# Patient Record
Sex: Female | Born: 1980 | Race: White | Hispanic: No | Marital: Married | Smoking: Former smoker
Health system: Southern US, Community
[De-identification: ages and names within clinical notes are randomized; demographics above are authoritative.]

## PROBLEM LIST (undated history)

## (undated) DIAGNOSIS — Z8669 Personal history of other diseases of the nervous system and sense organs: Secondary | ICD-10-CM

## (undated) HISTORY — DX: Personal history of other diseases of the nervous system and sense organs: Z86.69

---

## 2005-01-08 ENCOUNTER — Ambulatory Visit: Payer: Self-pay | Admitting: *Deleted

## 2014-07-16 ENCOUNTER — Ambulatory Visit (INDEPENDENT_AMBULATORY_CARE_PROVIDER_SITE_OTHER): Payer: BC Managed Care – PPO

## 2014-07-16 ENCOUNTER — Ambulatory Visit (INDEPENDENT_AMBULATORY_CARE_PROVIDER_SITE_OTHER): Payer: BC Managed Care – PPO | Admitting: Family Medicine

## 2014-07-16 VITALS — BP 106/68 | HR 68 | Temp 98.1°F | Resp 16 | Ht 67.0 in | Wt 136.2 lb

## 2014-07-16 DIAGNOSIS — R058 Other specified cough: Secondary | ICD-10-CM

## 2014-07-16 DIAGNOSIS — J209 Acute bronchitis, unspecified: Secondary | ICD-10-CM

## 2014-07-16 DIAGNOSIS — R05 Cough: Secondary | ICD-10-CM

## 2014-07-16 MED ORDER — BENZONATATE 200 MG PO CAPS: 200.0000 mg | ORAL_CAPSULE | Freq: Three times a day (TID) | ORAL | Status: DC | PRN

## 2014-07-16 MED ORDER — AZITHROMYCIN 250 MG PO TABS: ORAL_TABLET | ORAL | Status: DC

## 2014-07-16 NOTE — Patient Instructions (Signed)

## 2014-07-16 NOTE — Progress Notes (Signed)
° °  Subjective:    Patient ID: Shelby [name], female    DOB: 1980-10-23, 33 y.o.   MRN: [id number] This chart was scribed for Elvina SidleKurt Lauenstein, MD by Littie Deedsichard Sun, Medical Scribe. This patient was seen in Room 9 and the patient's care was started at 10:35 AM.    HPI HPI Comments: Shelby [name] is a 33 y.o. female who presents to the [facility] complaining of gradual onset, gradually worsening productive cough of dark green sputum that began about 1 month ago. Patient did not initially feel bad or have other symptoms when the cough began, but she woke up this morning feeling "swollen." The cough is noted to be worse in the morning, 1-2 hours after waking up, and she states she feels like she is "drowning." She has been able to sleep fine with the cough however. Patient denies fever and SOB. She also denies hx of asthma and smoking.  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   Review of Systems  Constitutional: Negative for fever.  Respiratory: Positive for cough and chest tightness. Negative for shortness of breath.   Psychiatric/Behavioral: Negative for sleep disturbance.       Objective:   Physical Exam CONSTITUTIONAL: Well developed/well nourished HEAD: Normocephalic/atraumatic EYES: EOM/PERRL ENMT: Mucous membranes moist NECK: supple no meningeal signs SPINE: entire spine nontender CV: S1/S2 noted, no murmurs/rubs/gallops noted LUNGS: Lungs are clear to auscultation bilaterally, no apparent distress ABDOMEN: soft, nontender, no rebound or guarding GU: no cva tenderness NEURO: Pt is awake/alert, moves all extremitiesx4 EXTREMITIES: pulses normal, full ROM SKIN: warm, color normal PSYCH: no abnormalities of mood noted  [facility] reading (PRIMARY) by  Dr. Milus GlazierLauenstein:  CXR-hyperinflation.        Assessment & Plan:   This chart was scribed in my presence and reviewed by me personally.    ICD-9-CM ICD-10-CM   1. Productive cough 786.2 R05 DG Chest 2 View    2. Acute bronchitis, unspecified organism 466.0 J20.9 benzonatate (TESSALON) 200 MG capsule     azithromycin (ZITHROMAX Z-PAK) 250 MG tablet     Signed, Elvina SidleKurt Lauenstein, MD

## 2015-12-12 ENCOUNTER — Other Ambulatory Visit: Payer: Self-pay | Admitting: Gastroenterology

## 2015-12-12 DIAGNOSIS — R079 Chest pain, unspecified: Secondary | ICD-10-CM

## 2015-12-20 ENCOUNTER — Ambulatory Visit (HOSPITAL_COMMUNITY): Payer: Self-pay

## 2015-12-20 ENCOUNTER — Other Ambulatory Visit (HOSPITAL_COMMUNITY): Payer: Self-pay

## 2016-02-06 ENCOUNTER — Other Ambulatory Visit: Payer: Self-pay | Admitting: Gastroenterology

## 2016-02-06 DIAGNOSIS — R6881 Early satiety: Secondary | ICD-10-CM

## 2016-02-06 DIAGNOSIS — R1084 Generalized abdominal pain: Secondary | ICD-10-CM

## 2016-02-10 ENCOUNTER — Ambulatory Visit
Admission: RE | Admit: 2016-02-10 | Discharge: 2016-02-10 | Disposition: A | Discharge status: Home / Self Care | Payer: BLUE CROSS/BLUE SHIELD | Source: Ambulatory Visit | Attending: Gastroenterology | Admitting: Gastroenterology

## 2016-02-10 DIAGNOSIS — R1084 Generalized abdominal pain: Secondary | ICD-10-CM

## 2016-02-10 MED ORDER — IOPAMIDOL (ISOVUE-300) INJECTION 61%
100.0000 mL | Freq: Once | INTRAVENOUS | Status: AC | PRN
  Administered 2016-02-10: 100 mL via INTRAVENOUS

## 2016-02-15 ENCOUNTER — Ambulatory Visit (HOSPITAL_COMMUNITY): Payer: BLUE CROSS/BLUE SHIELD | Attending: Gastroenterology

## 2017-02-28 IMAGING — CT CT ABD-PELV W/ CM
2 of 4 series · 10 of 36 positions shown, 17 images · IV contrast (READICAT/WATER & [ID] ISOVUE 300)
Comparison: None.

CLINICAL DATA: Mid to upper abdominal pain

EXAM:
CT ABDOMEN AND PELVIS WITH CONTRAST
TECHNIQUE: Multidetector CT imaging of the abdomen and pelvis was performed
using the standard protocol following bolus administration of
intravenous contrast.
CONTRAST:  100mL S3PGAM-UCC IOPAMIDOL (S3PGAM-UCC) INJECTION 61%

[Series 3: abd/pelvis with · axial · 0.73mm/px · z∈[-430,-86]mm · 9 of 87 slices shown, 15 images]
[im 9/87  soft-tissue]
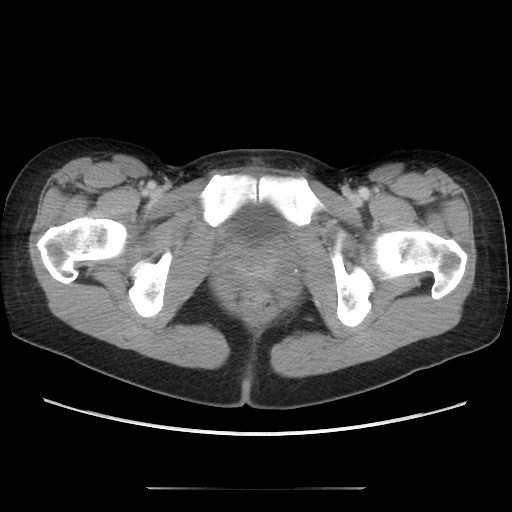
[im 9/87  bone]
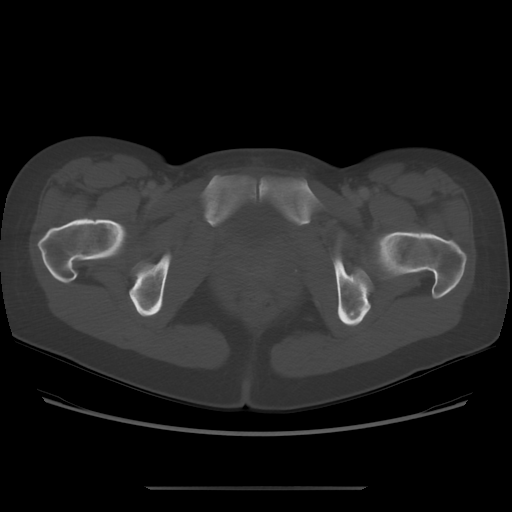
[im 18/87  soft-tissue]
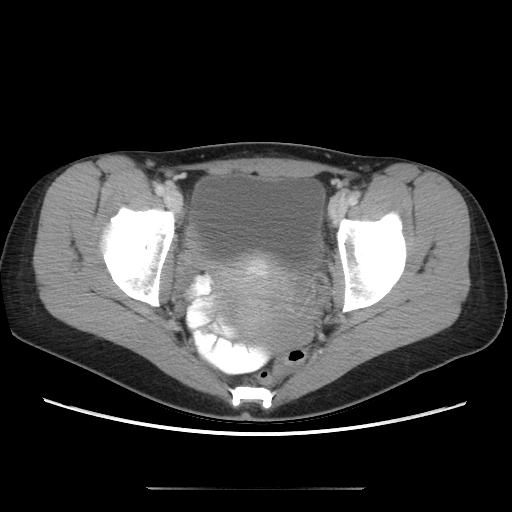
[im 26/87  soft-tissue]
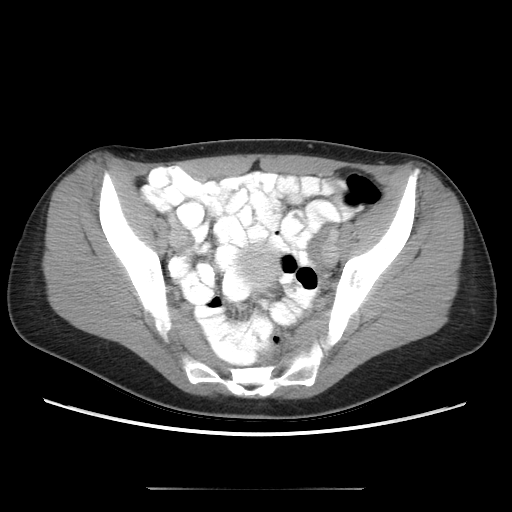
[im 35/87  soft-tissue]
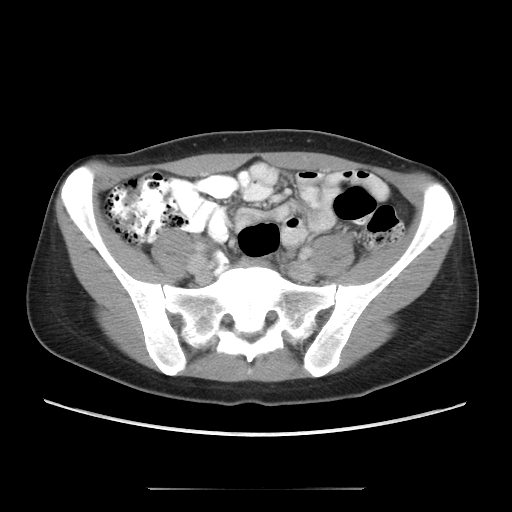
[im 44/87  soft-tissue]
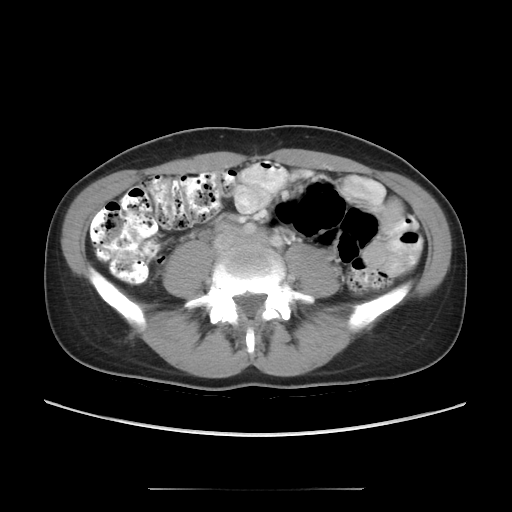
[im 52/87  soft-tissue]
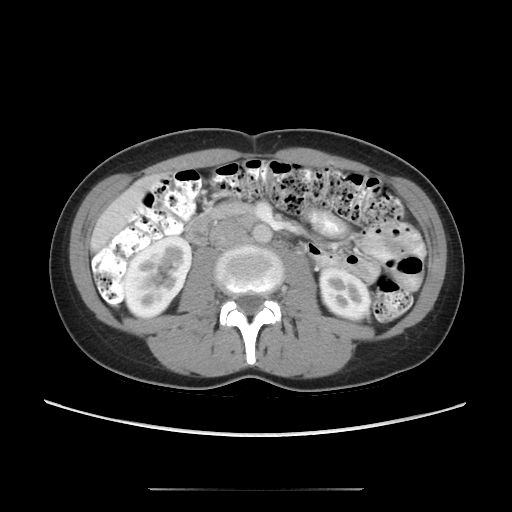
[im 52/87  lung]
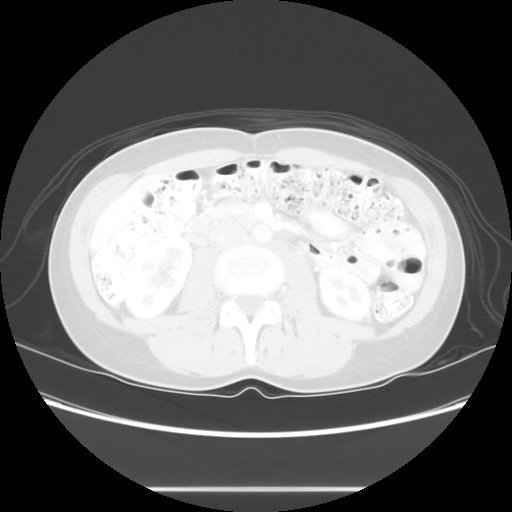
[im 61/87  soft-tissue]
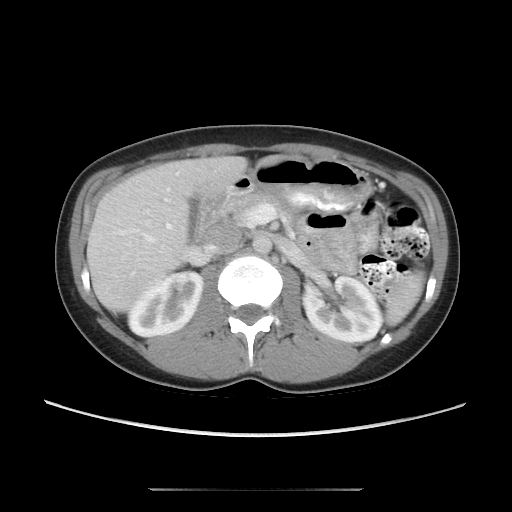
[im 61/87  lung]
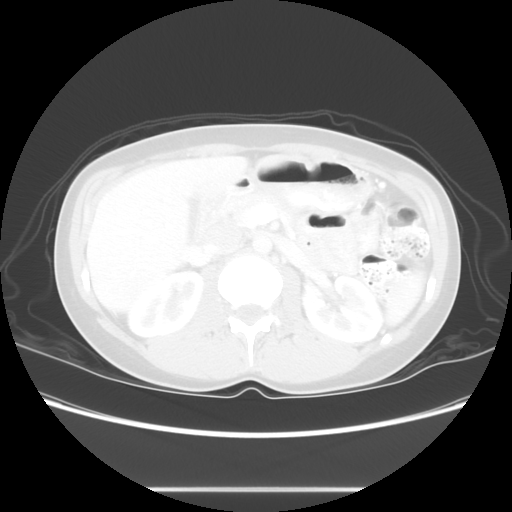
[im 69/87  soft-tissue]
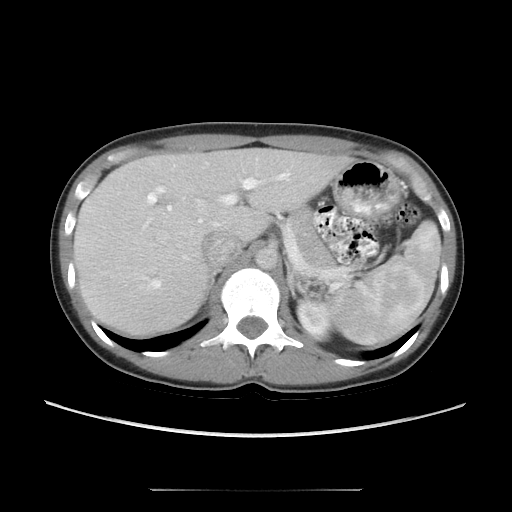
[im 69/87  lung]
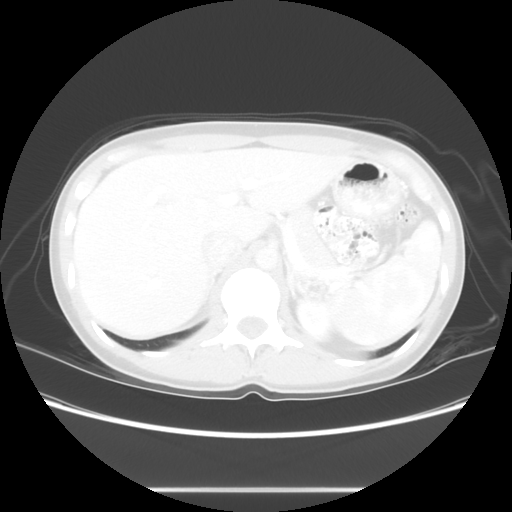
[im 78/87  soft-tissue]
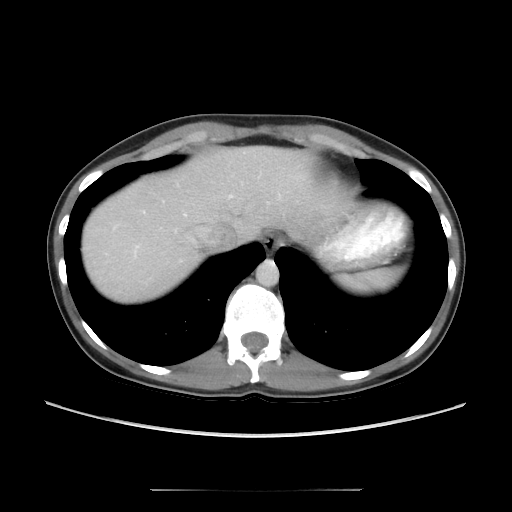
[im 78/87  lung]
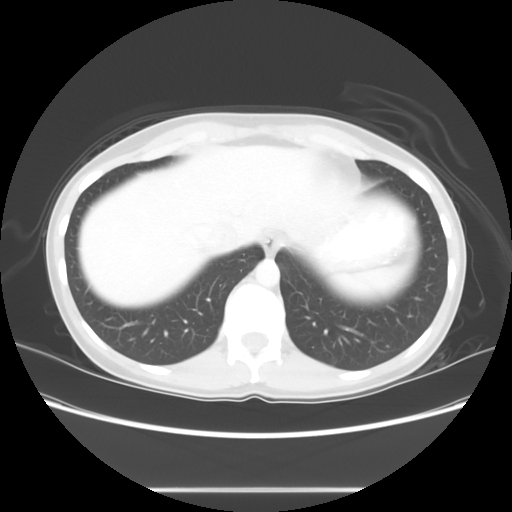
[im 78/87  bone]
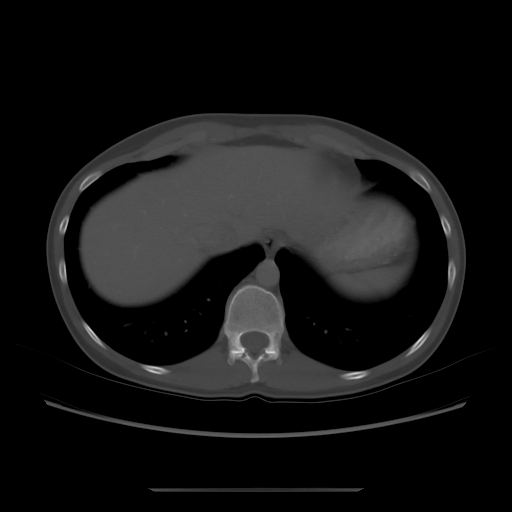

[Series 601: coronal body · coronal · 0.91mm/px · 1 of 99 slices shown, 2 images]
[im 33/99  soft-tissue]
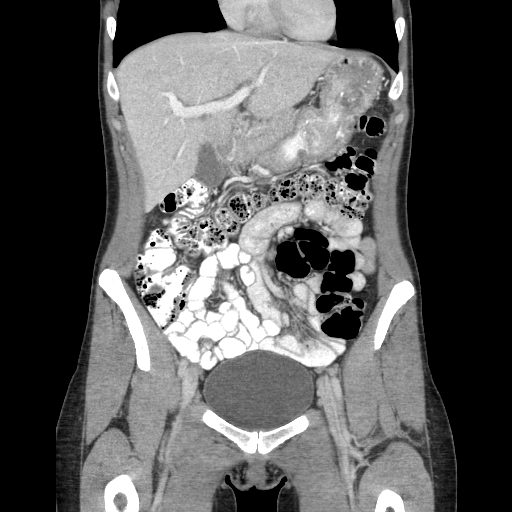
[im 33/99  bone]
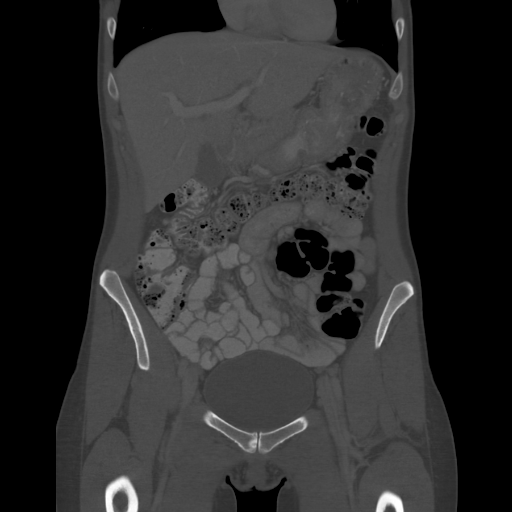

[10 of 36 positions shown; findings below may reference images not displayed]

FINDINGS: Lower chest:  Unremarkable

Hepatobiliary: Unremarkable

Pancreas: Unremarkable

Spleen: Unremarkable

Adrenals/Urinary Tract: Unremarkable

Stomach/Bowel: Unremarkable.  Appendix normal.

Vascular/Lymphatic: Unremarkable

Reproductive: Unremarkable

Other: No supplemental non-categorized findings.

Musculoskeletal: Unremarkable
IMPRESSION: 1. Normal CT appearance of the abdomen and pelvis, without a
specific cause for the patient's symptoms identified.

## 2019-03-31 HISTORY — PX: CATARACT EXTRACTION: SUR2

## 2019-08-20 ENCOUNTER — Other Ambulatory Visit: Payer: Self-pay

## 2019-08-20 ENCOUNTER — Ambulatory Visit (INDEPENDENT_AMBULATORY_CARE_PROVIDER_SITE_OTHER): Payer: 59 | Admitting: Allergy and Immunology

## 2019-08-20 ENCOUNTER — Encounter: Payer: Self-pay | Admitting: Allergy and Immunology

## 2019-08-20 VITALS — BP 122/86 | HR 65 | Temp 98.2°F | Resp 16 | Ht 68.8 in | Wt 137.0 lb

## 2019-08-20 DIAGNOSIS — K224 Dyskinesia of esophagus: Secondary | ICD-10-CM | POA: Diagnosis not present

## 2019-08-20 DIAGNOSIS — G43909 Migraine, unspecified, not intractable, without status migrainosus: Secondary | ICD-10-CM | POA: Diagnosis not present

## 2019-08-20 DIAGNOSIS — T783XXA Angioneurotic edema, initial encounter: Secondary | ICD-10-CM | POA: Diagnosis not present

## 2019-08-20 NOTE — Patient Instructions (Addendum)
  1.  Allergen avoidance measures?  2.  Remain away from all forms of caffeine including chocolate  3. Gluten free diet?  4. Blood - tryptase , C4, CBC w/diff, Celiac screen w/IgA  5. Further evaluation?  6. Obtain covid vaccine

## 2019-08-20 NOTE — Progress Notes (Signed)
[place] - [place] - [place] - [place] - [place]   Dear Dr. Elnoria Howard,  Thank you for referring Shelby [name] to the [facility] [place] on 08/20/2019.   Below is a summation of this patient's evaluation and recommendations.  Thank you for your referral. I will keep you informed about this patient's response to treatment.   If you have any questions please do not hesitate to contact me.   Sincerely,  Jessica Priest, MD Allergy / Immunology [facility]   ______________________________________________________________________    NEW PATIENT NOTE  Referring Provider: Jeani Hawking, MD Primary Provider: Gillian Scarce Date of office visit: 08/20/2019    Subjective:   Chief Complaint:  Shelby [name] (DOB: 04-Jun-1981) is a 39 y.o. female who [name] to the clinic on 08/20/2019 with a chief complaint of GI Problem .     HPI: Shelby [name] to this clinic in evaluation of possible allergic disease.  Her history dates back about 5 years at which point in time she started to develop these recurrent episodes of headache, chest fullness and pressure and tightness, coughing, a global bed feeling, and a requirement to fast for at least 8 to 12 hours.  These episodes would always present as a package.  After the first day her headache would resolve but she would still have this chest discomfort and a global bed feeling and that would take another 24 hours or so to resolve.  She never had any other associated systemic or constitutional symptoms with these episodes.  Interestingly, she sometimes gets a headache without the other components of this package.  Her headache is usually in the frontal region and is described as "constant".  It is not associated with any scotoma or dizziness but it usually remains until she can get to sleep.  She has taken ibuprofen in the past but she is  not really sure that that helps very much.  If she eats chocolate she gets an immediate headache.  If she consumes peppermint she gets an immediate headache.  She has undergone pretty extensive evaluation for this issue including evaluation with Dr. Elnoria Howard, gastroenterology, and apparently she has had a normal upper endoscopy.  As well, it sounds as though celiac disease has been ruled out.  After her very bad episode recently, which presented approximately 3 weeks ago, she had a significant alteration in what she eats.  She has eliminated all gluten, has dramatically consolidated her caffeine consumption to just 1 tea in the morning, and only eats whole foods.  Over these past 2 weeks this is the best that she has felt in a prolonged period in time.  Past Medical History:  Diagnosis Date  . History of cataract     Past Surgical History:  Procedure Laterality Date  . CATARACT EXTRACTION  03/2019    Allergies as of 08/20/2019   No Known Allergies     Medication List    VITAMIN C PO Take by mouth daily.   VITAMIN D3 PO Take by mouth daily.       Review of systems negative except as noted in HPI / PMHx or noted below:  Review of Systems  Constitutional: Negative.   HENT: Negative.   Eyes: Negative.   Respiratory: Negative.   Cardiovascular: Negative.   Gastrointestinal: Negative.   Genitourinary: Negative.   Musculoskeletal: Negative.   Skin: Negative.   Neurological: Negative.   Endo/Heme/Allergies: Negative.  Psychiatric/Behavioral: Negative.     Family History  Problem Relation Age of Onset  . Lung cancer Maternal Grandfather   . Food Allergy Father   . Asthma Father   . Other Father        Cancerous tumor on kidney  . Esophageal cancer Maternal Uncle   . Breast cancer Half-Sister     Social History   Socioeconomic History  . Marital status: Married    Spouse name: Not on file  . Number of children: Not on file  . Years of education: Not on file  .  Highest education level: Not on file  Occupational History  . Not on file  Tobacco Use  . Smoking status: Former Smoker    Types: Cigarettes  . Smokeless tobacco: Never Used  . Tobacco comment: smoked in college  Substance and Sexual Activity  . Alcohol use: Yes    Alcohol/week: 0.0 standard drinks  . Drug use: No  . Sexual activity: Not on file  Other Topics Concern  . Not on file  Social History Narrative  . Not on file   Social Determinants of Health   Financial Resource Strain:   . Difficulty of Paying Living Expenses: Not on file  Food Insecurity:   . Worried About Programme researcher, broadcasting/film/video in the Last Year: Not on file  . Ran Out of Food in the Last Year: Not on file  Transportation Needs:   . Lack of Transportation (Medical): Not on file  . Lack of Transportation (Non-Medical): Not on file  Physical Activity:   . Days of Exercise per Week: Not on file  . Minutes of Exercise per Session: Not on file  Stress:   . Feeling of Stress : Not on file  Social Connections:   . Frequency of Communication with Friends and Family: Not on file  . Frequency of Social Gatherings with Friends and Family: Not on file  . Attends Religious Services: Not on file  . Active Member of Clubs or Organizations: Not on file  . Attends Banker Meetings: Not on file  . Marital Status: Not on file  Intimate Partner Violence:   . Fear of Current or Ex-Partner: Not on file  . Emotionally Abused: Not on file  . Physically Abused: Not on file  . Sexually Abused: Not on file    Environmental and Social history  Lives in a house with a dry environment, 2 dogs located inside the household, no carpet in the bedroom, no plastic on the bed, no plastic on the pillow, and no smoking ongoing with inside the household.  She works in an office setting as a Psychologist, occupational.  Objective:   Vitals:   08/20/19 1441  BP: 122/86  Pulse: 65  Resp: 16  Temp: 98.2 F (36.8 C)  SpO2: 98%   Height: 5' 8.8"  (174.8 cm) Weight: 137 lb (62.1 kg)  Physical Exam Constitutional:      Appearance: She is not diaphoretic.  HENT:     Head: Normocephalic.     Right Ear: Tympanic membrane, ear canal and external ear normal.     Left Ear: Tympanic membrane, ear canal and external ear normal.     Nose: Nose normal. No mucosal edema or rhinorrhea.     Mouth/Throat:     Pharynx: Uvula midline. No oropharyngeal exudate.  Eyes:     Conjunctiva/sclera: Conjunctivae normal.  Neck:     Thyroid: No thyromegaly.     Trachea: Trachea normal. No  tracheal tenderness or tracheal deviation.  Cardiovascular:     Rate and Rhythm: Normal rate and regular rhythm.     Heart sounds: Normal heart sounds, S1 normal and S2 normal. No murmur.  Pulmonary:     Effort: No respiratory distress.     Breath sounds: Normal breath sounds. No stridor. No wheezing or rales.  Lymphadenopathy:     Head:     Right side of head: No tonsillar adenopathy.     Left side of head: No tonsillar adenopathy.     Cervical: No cervical adenopathy.  Skin:    Findings: No erythema or rash.     Nails: There is no clubbing.  Neurological:     Mental Status: She is alert.     Diagnostics: Allergy skin tests were performed.  She did not demonstrate any hypersensitivity against a screening panel of foods.  Assessment and Plan:    1. Migraine syndrome   2. Angioedema, initial encounter   3. Esophageal spasm     1.  Allergen avoidance measures?  2.  Remain away from all forms of caffeine including chocolate  3. Gluten free diet?  4. Blood - tryptase , C4, CBC w/diff, Celiac screen w/IgA  5. Further evaluation?  6. Obtain covid vaccine  I think the most likely explanation for Shelby [name]'s package of headache, GI symptoms, mid airway respiratory symptoms, is migraine with visceral involvement.  We will make that assumption and keep her away from all caffeine.  If she does well after a period in time I encouraged her to reintroduce  gluten to her diet to see if she truly has a hypersensitivity directed against this food product.  We will obtain blood test to rule out other etiologic factors that can be responsible for her sign symptom complex.  She will keep in contact with me noting her response to this approach and further evaluation and treatment will be based upon this response.  Jiles Prows, MD Allergy / Immunology [facility] [place]

## 2019-08-22 LAB — CBC WITH DIFFERENTIAL
Basophils Absolute: 0 10*3/uL (ref 0.0–0.2)
Basos: 1 %
EOS (ABSOLUTE): 0.1 10*3/uL (ref 0.0–0.4)
Eos: 1 %
Hematocrit: 44.3 % (ref 34.0–46.6)
Hemoglobin: 14.6 g/dL (ref 11.1–15.9)
Immature Grans (Abs): 0 10*3/uL (ref 0.0–0.1)
Immature Granulocytes: 0 %
Lymphocytes Absolute: 2 10*3/uL (ref 0.7–3.1)
Lymphs: 33 %
MCH: 30.7 pg (ref 26.6–33.0)
MCHC: 33 g/dL (ref 31.5–35.7)
MCV: 93 fL (ref 79–97)
Monocytes Absolute: 0.5 10*3/uL (ref 0.1–0.9)
Monocytes: 8 %
Neutrophils Absolute: 3.6 10*3/uL (ref 1.4–7.0)
Neutrophils: 57 %
RBC: 4.76 x10E6/uL (ref 3.77–5.28)
RDW: 11.8 % (ref 11.7–15.4)
WBC: 6.2 10*3/uL (ref 3.4–10.8)

## 2019-08-22 LAB — CELIAC PANEL 10
Antigliadin Abs, IgA: 5 units (ref 0–19)
Endomysial IgA: NEGATIVE
Gliadin IgG: 7 units (ref 0–19)
IgA/Immunoglobulin A, Serum: 257 mg/dL (ref 87–352)
Tissue Transglut Ab: 6 U/mL — ABNORMAL HIGH (ref 0–5)
Transglutaminase IgA: <2 U/mL (ref 0–3)

## 2019-08-22 LAB — TRYPTASE: Tryptase: 4.2 ug/L (ref 2.2–13.2)

## 2019-08-22 LAB — C4 COMPLEMENT: Complement C4, Serum: 22 mg/dL (ref 12–38)

## 2019-08-24 ENCOUNTER — Encounter: Payer: Self-pay | Admitting: Allergy and Immunology

## 2019-09-08 ENCOUNTER — Encounter: Payer: Self-pay | Admitting: *Deleted

## 2019-09-15 ENCOUNTER — Ambulatory Visit: Payer: Self-pay | Admitting: Allergy and Immunology

## 2021-06-19 ENCOUNTER — Emergency Department: Admit: 2021-06-19 | Discharge status: Absent without leave | Payer: Self-pay

## 2021-06-19 ENCOUNTER — Emergency Department (INDEPENDENT_AMBULATORY_CARE_PROVIDER_SITE_OTHER): Payer: 59

## 2021-06-19 ENCOUNTER — Emergency Department (INDEPENDENT_AMBULATORY_CARE_PROVIDER_SITE_OTHER)
Admission: EM | Admit: 2021-06-19 | Discharge: 2021-06-19 | Disposition: A | Discharge status: Home / Self Care | Payer: 59 | Source: Home / Self Care | Attending: Family Medicine | Admitting: Family Medicine

## 2021-06-19 DIAGNOSIS — R0602 Shortness of breath: Secondary | ICD-10-CM

## 2021-06-19 DIAGNOSIS — R0789 Other chest pain: Secondary | ICD-10-CM

## 2021-06-19 DIAGNOSIS — U071 COVID-19: Secondary | ICD-10-CM

## 2021-06-19 NOTE — Discharge Instructions (Signed)
The chest x-ray is normal No evidence of COVID-pneumonia Your symptoms are consistent with a COVID respiratory infection Continue with rest, fluids, and over-the-counter cough and cold medicine May use the albuterol as needed Call your doctor if not improving by Friday

## 2021-06-19 NOTE — ED Triage Notes (Signed)
Pt presents with worsening chest tightness, SOB, and cough since being diagnosed with Covid Friday. Pt states she has been using an albuterol inhaler approx. Q4h. Pt taking amoxicillin for prior resp. Infection.

## 2021-06-20 NOTE — ED Provider Notes (Addendum)
[facility]    CSN: [id number] Arrival date & time: 06/19/21  1549      History   Chief Complaint Chief Complaint  Patient presents with   Cough    COVID pos 11/18   Shortness of Breath   Nasal Congestion    HPI Shelby [name] is a 40 y.o. female.   HPI Patient was diagnosed with COVID 2 or 3 days ago.  She is here because she continues to have shortness of breath and cough.  She states that she has taken amoxicillin, steroids, and has an albuterol inhaler.  In spite of that she wonders about the shortness of breath.  She is never had COVID before.  She is COVID vaccinated.  No dizziness.  No chest pain  Past Medical History:  Diagnosis Date   History of cataract     There are no problems to display for this patient.   Past Surgical History:  Procedure Laterality Date   CATARACT EXTRACTION  03/2019    OB History   No obstetric history on file.      Home Medications    Prior to Admission medications   Medication Sig Start Date End Date Taking? Authorizing Provider  ALBUTEROL IN Inhale into the lungs.   Yes [provider]  amoxicillin (AMOXIL) 875 MG tablet Take 875 mg by mouth 2 (two) times daily.   Yes [provider]  Ascorbic Acid (VITAMIN C PO) Take by mouth daily.    [provider]  Cholecalciferol (VITAMIN D3 PO) Take by mouth daily.    [provider]    Family History Family History  Problem Relation Age of Onset   Lung cancer Maternal Grandfather    Food Allergy Father    Asthma Father    Other Father        Cancerous tumor on kidney   Esophageal cancer Maternal Uncle    Breast cancer Half-Sister     Social History Social History   Tobacco Use   Smoking status: Former    Types: Cigarettes   Smokeless tobacco: Never   Tobacco comments:    smoked in college  Substance Use Topics   Alcohol use: Yes    Alcohol/week: 0.0 standard drinks   Drug use: No     Allergies   Patient  has no known allergies.   Review of Systems Review of Systems See HPI  Physical Exam Triage Vital Signs ED Triage Vitals  Enc Vitals Group     BP 06/19/21 1706 (!) 142/86     Pulse Rate 06/19/21 1706 79     Resp 06/19/21 1706 16     Temp 06/19/21 1706 98 F (36.7 C)     Temp Source 06/19/21 1706 Oral     SpO2 06/19/21 1706 100 %     Weight --      Height --      Head Circumference --      Peak Flow --      Pain Score 06/19/21 1656 2     Pain Loc --      Pain Edu? --      Excl. in GC? --    No data found.  Updated Vital Signs BP (!) 142/86 (BP Location: Left Arm)   Pulse 79   Temp 98 F (36.7 C) (Oral)   Resp 16   SpO2 100%       Physical Exam Constitutional:      General: She is not in  acute distress.    Appearance: She is well-developed. She is ill-appearing.  HENT:     Head: Normocephalic and atraumatic.  Eyes:     Conjunctiva/sclera: Conjunctivae normal.     Pupils: Pupils are equal, round, and reactive to light.  Cardiovascular:     Rate and Rhythm: Normal rate and regular rhythm.     Heart sounds: Normal heart sounds.  Pulmonary:     Effort: Pulmonary effort is normal. No respiratory distress.     Breath sounds: Normal breath sounds. No decreased breath sounds, wheezing, rhonchi or rales.  Chest:     Chest wall: No tenderness.  Abdominal:     General: There is no distension.     Palpations: Abdomen is soft.  Musculoskeletal:        General: Normal range of motion.     Cervical back: Normal range of motion.  Skin:    General: Skin is warm and dry.  Neurological:     General: No focal deficit present.     Mental Status: She is alert.     UC Treatments / Results  Labs (all labs ordered are listed, but only abnormal results are displayed) Labs Reviewed - No data to display  EKG   Radiology DG Chest 2 View  Result Date: 06/19/2021 CLINICAL DATA:  Chest tightness EXAM: CHEST - 2 VIEW COMPARISON:  07/16/2014 FINDINGS: The heart size and  mediastinal contours are within normal limits. Both lungs are clear. The visualized skeletal structures are unremarkable. IMPRESSION: No active cardiopulmonary disease. Electronically Signed   By: Jasmine Pang M.D.   On: 06/19/2021 18:14    Procedures Procedures (including critical care time)  Medications Ordered in UC Medications - No data to display  Initial Impression / Assessment and Plan / UC Course  I have reviewed the triage vital signs and the nursing notes.  Pertinent labs & imaging results that were available during my care of the patient were reviewed by me and considered in my medical decision making (see chart for details).     I explained to the patient that this is typical for COVID.  Shortness of breath symptoms that outweighs physical exam findings and x-rays not unusual.  Home care is recommended.  Go to ER if worse.  Call primary care if fails to improve. Final Clinical Impressions(s) / UC Diagnoses   Final diagnoses:  COVID-19  Shortness of breath     Discharge Instructions      The chest x-ray is normal No evidence of COVID-pneumonia Your symptoms are consistent with a COVID respiratory infection Continue with rest, fluids, and over-the-counter cough and cold medicine May use the albuterol as needed Call your doctor if not improving by Friday    ED Prescriptions   None    PDMP not reviewed this encounter.   Eustace Moore, MD 06/20/21 1501    Eustace Moore, MD 06/20/21 1501

## 2021-06-25 ENCOUNTER — Other Ambulatory Visit: Payer: Self-pay

## 2021-06-25 ENCOUNTER — Emergency Department (INDEPENDENT_AMBULATORY_CARE_PROVIDER_SITE_OTHER)
Admission: EM | Admit: 2021-06-25 | Discharge: 2021-06-25 | Disposition: A | Discharge status: Home / Self Care | Payer: 59 | Source: Home / Self Care | Attending: Family Medicine | Admitting: Family Medicine

## 2021-06-25 DIAGNOSIS — R0981 Nasal congestion: Secondary | ICD-10-CM | POA: Diagnosis not present

## 2021-06-25 DIAGNOSIS — U071 COVID-19: Secondary | ICD-10-CM

## 2021-06-25 NOTE — Discharge Instructions (Signed)
Continue a low activity level, rest, drink lots of fluids I do not feel you are contagious for COVID 19 Run a humidifier if you have one Start Flonase.  2 sprays a day for the first 3 days then 1 a day until your symptoms have improved See your doctor if you fail to improve through next week

## 2021-06-25 NOTE — ED Provider Notes (Signed)
[facility]    CSN: [id number] Arrival date & time: 06/25/21  1308      History   Chief Complaint Chief Complaint  Patient presents with   Cough   Facial Pain    HPI Shelby [name] is a 40 y.o. female.   HPI  Patient went to her physician and was diagnosed with an upper respiratory infection in the beginning of October.  She was given Augmentin, prednisone, and cough medication.  As she was finishing up these medications she was exposed to COVID and became sick again.  Her COVID test was positive.  She finished her COVID medication and thought she was better.  Yesterday was feeling fairly well.  Today woke up with nasal congestion chest congestion and a pressure in her chest.  She is most unhappy that she is having a relapse of her symptoms.  She wants to know when she can go back to work.  She wants to know when she can be around her children.  She wants to know if she needs to go back on antibiotics or medication.  She took a COVID test 2 days ago and it was still positive.  Past Medical History:  Diagnosis Date   History of cataract     There are no problems to display for this patient.   Past Surgical History:  Procedure Laterality Date   CATARACT EXTRACTION  03/2019    OB History   No obstetric history on file.      Home Medications    Prior to Admission medications   Medication Sig Start Date End Date Taking? Authorizing Provider  ALBUTEROL IN Inhale into the lungs.    [provider]  Ascorbic Acid (VITAMIN C PO) Take by mouth daily.    [provider]  Cholecalciferol (VITAMIN D3 PO) Take by mouth daily.    [provider]    Family History Family History  Problem Relation Age of Onset   Lung cancer Maternal Grandfather    Food Allergy Father    Asthma Father    Other Father        Cancerous tumor on kidney   Esophageal cancer Maternal Uncle    Breast cancer Half-Sister     Social History Social  History   Tobacco Use   Smoking status: Former    Types: Cigarettes   Smokeless tobacco: Never   Tobacco comments:    smoked in college  Substance Use Topics   Alcohol use: Yes    Alcohol/week: 0.0 standard drinks   Drug use: No     Allergies   Patient has no known allergies.   Review of Systems Review of Systems See HPI  Physical Exam Triage Vital Signs ED Triage Vitals  Enc Vitals Group     BP 06/25/21 1355 (!) 142/88     Pulse Rate 06/25/21 1355 88     Resp 06/25/21 1355 16     Temp 06/25/21 1355 98.4 F (36.9 C)     Temp Source 06/25/21 1355 Oral     SpO2 06/25/21 1355 100 %     Weight --      Height --      Head Circumference --      Peak Flow --      Pain Score 06/25/21 1356 0     Pain Loc --      Pain Edu? --      Excl. in GC? --    No data found.  Updated Vital Signs BP (!) 142/88 (BP Location: Left Arm)   Pulse 88   Temp 98.4 F (36.9 C) (Oral)   Resp 16   LMP 06/24/2021 Comment: Pt states her menstrual l cycle just started yesterday.  SpO2 100%      Physical Exam Constitutional:      General: She is not in acute distress.    Appearance: She is well-developed. She is ill-appearing.  HENT:     Head: Normocephalic and atraumatic.     Right Ear: Tympanic membrane and ear canal normal.     Left Ear: Tympanic membrane and ear canal normal.     Nose: Rhinorrhea present.     Mouth/Throat:     Mouth: Mucous membranes are moist.     Pharynx: No posterior oropharyngeal erythema.  Eyes:     Conjunctiva/sclera: Conjunctivae normal.     Pupils: Pupils are equal, round, and reactive to light.  Cardiovascular:     Rate and Rhythm: Normal rate and regular rhythm.     Heart sounds: Normal heart sounds.  Pulmonary:     Effort: Pulmonary effort is normal. No respiratory distress.     Breath sounds: Normal breath sounds.  Abdominal:     General: There is no distension.     Palpations: Abdomen is soft.  Musculoskeletal:        General: Normal range  of motion.     Cervical back: Normal range of motion.  Lymphadenopathy:     Cervical: Cervical adenopathy present.  Skin:    General: Skin is warm and dry.  Neurological:     Mental Status: She is alert.     UC Treatments / Results  Labs (all labs ordered are listed, but only abnormal results are displayed) Labs Reviewed - No data to display  EKG   Radiology No results found.  Procedures Procedures (including critical care time)  Medications Ordered in UC Medications - No data to display  Initial Impression / Assessment and Plan / UC Course  I have reviewed the triage vital signs and the nursing notes.  Pertinent labs & imaging results that were available during my care of the patient were reviewed by me and considered in my medical decision making (see chart for details).     We discussed that this not unusual for some people to have symptoms related to COVID that can last a couple weeks or longer.  Symptomatic care    Final Clinical Impressions(s) / UC Diagnoses   Final diagnoses:  COVID-19  Nasal congestion     Discharge Instructions      Continue a low activity level, rest, drink lots of fluids I do not feel you are contagious for COVID 19 Run a humidifier if you have one Start Flonase.  2 sprays a day for the first 3 days then 1 a day until your symptoms have improved See your doctor if you fail to improve through next week   ED Prescriptions   None    PDMP not reviewed this encounter.   Eustace Moore, MD 06/28/21 [phone]

## 2021-06-25 NOTE — ED Triage Notes (Signed)
Pt present facial pressure with chest congestion and head. Symptom started a week ago. Pt state that 11 days ago she was positive for covid. Pt states that her symptoms for covid has gotten better just the congestion and chest tightness feels worst.

## 2022-07-08 IMAGING — DX DG CHEST 2V
2 series · 2 of 2 positions shown · non-contrast
Comparison: 07/16/2014

CLINICAL DATA: Chest tightness

EXAM:
CHEST - 2 VIEW

[chest pa]
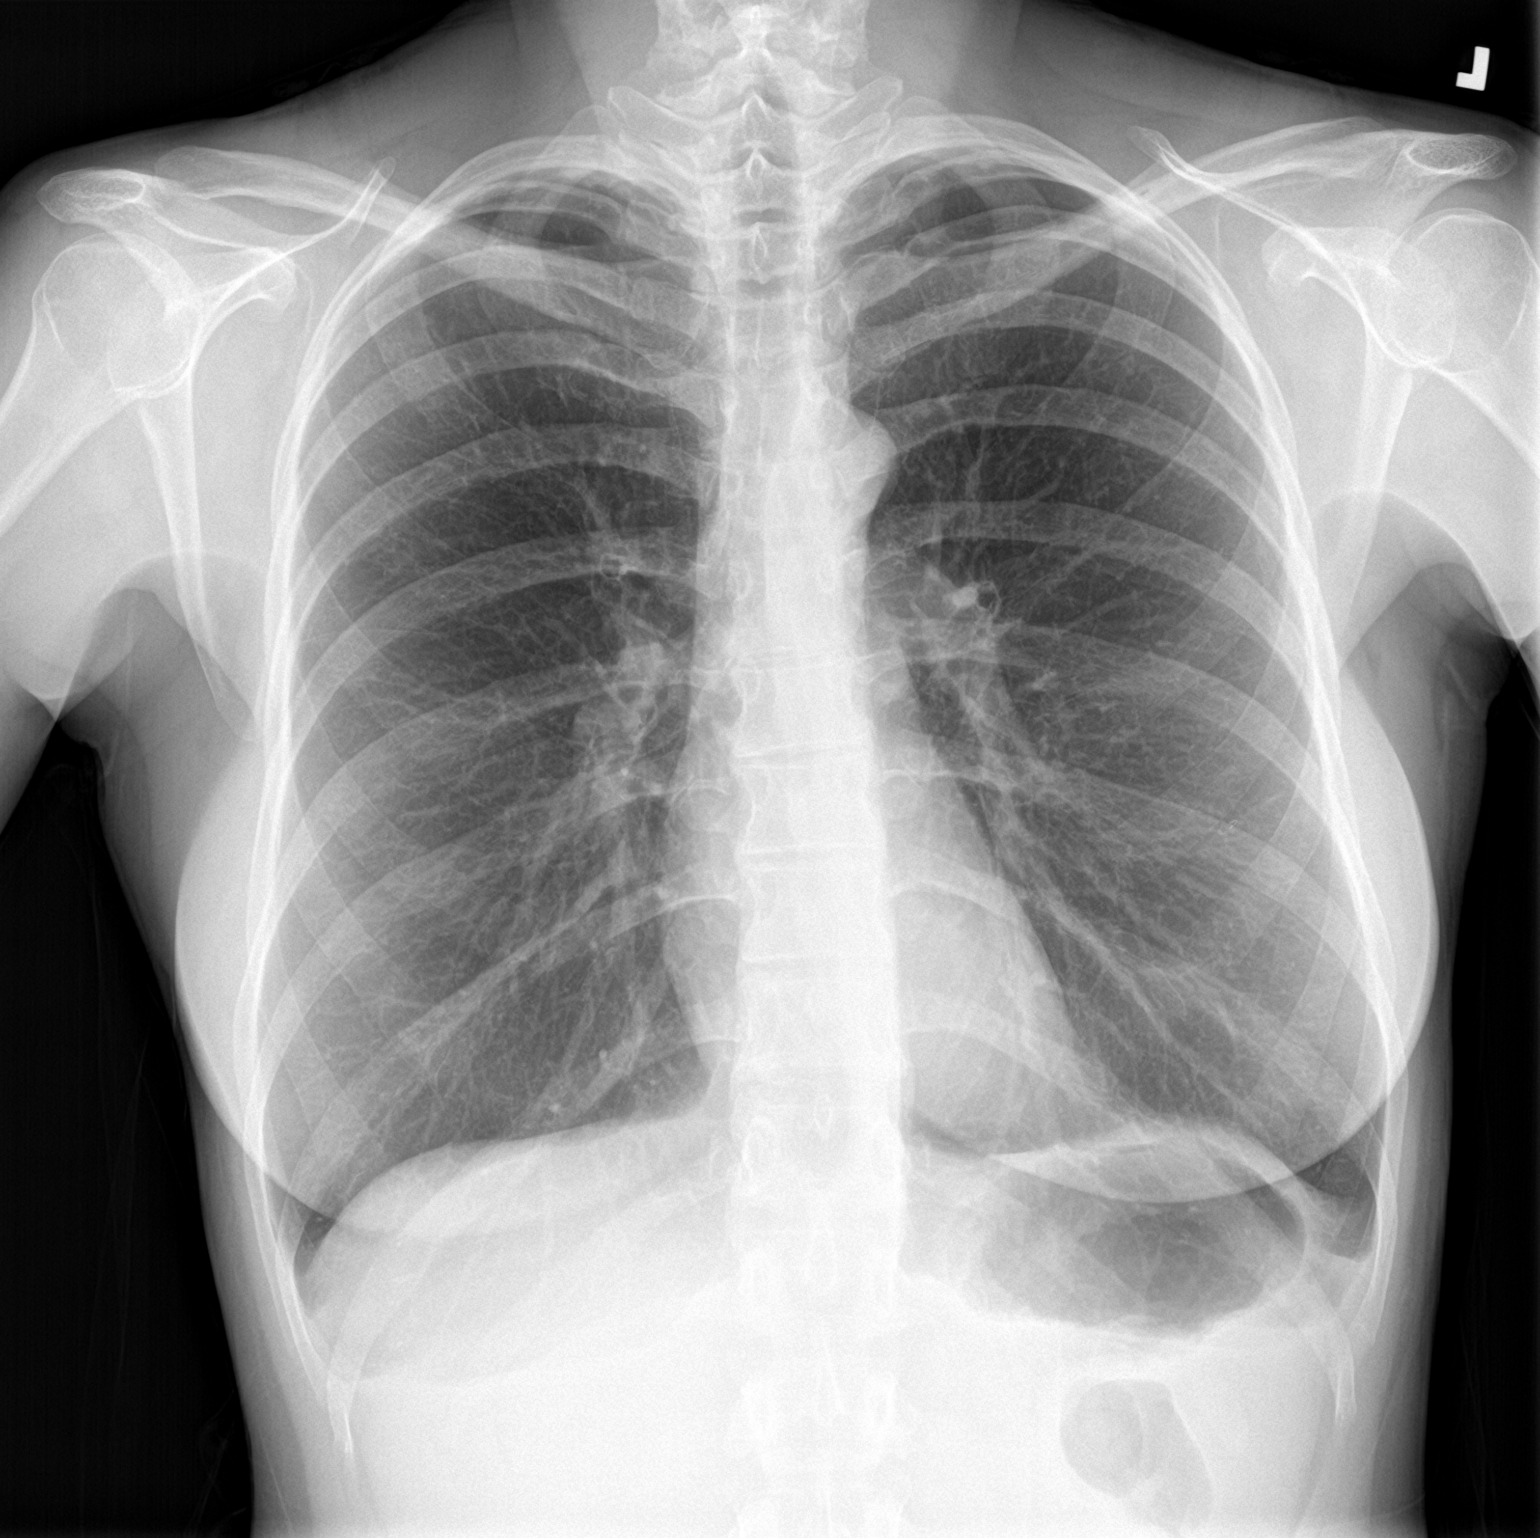

[chest lat]
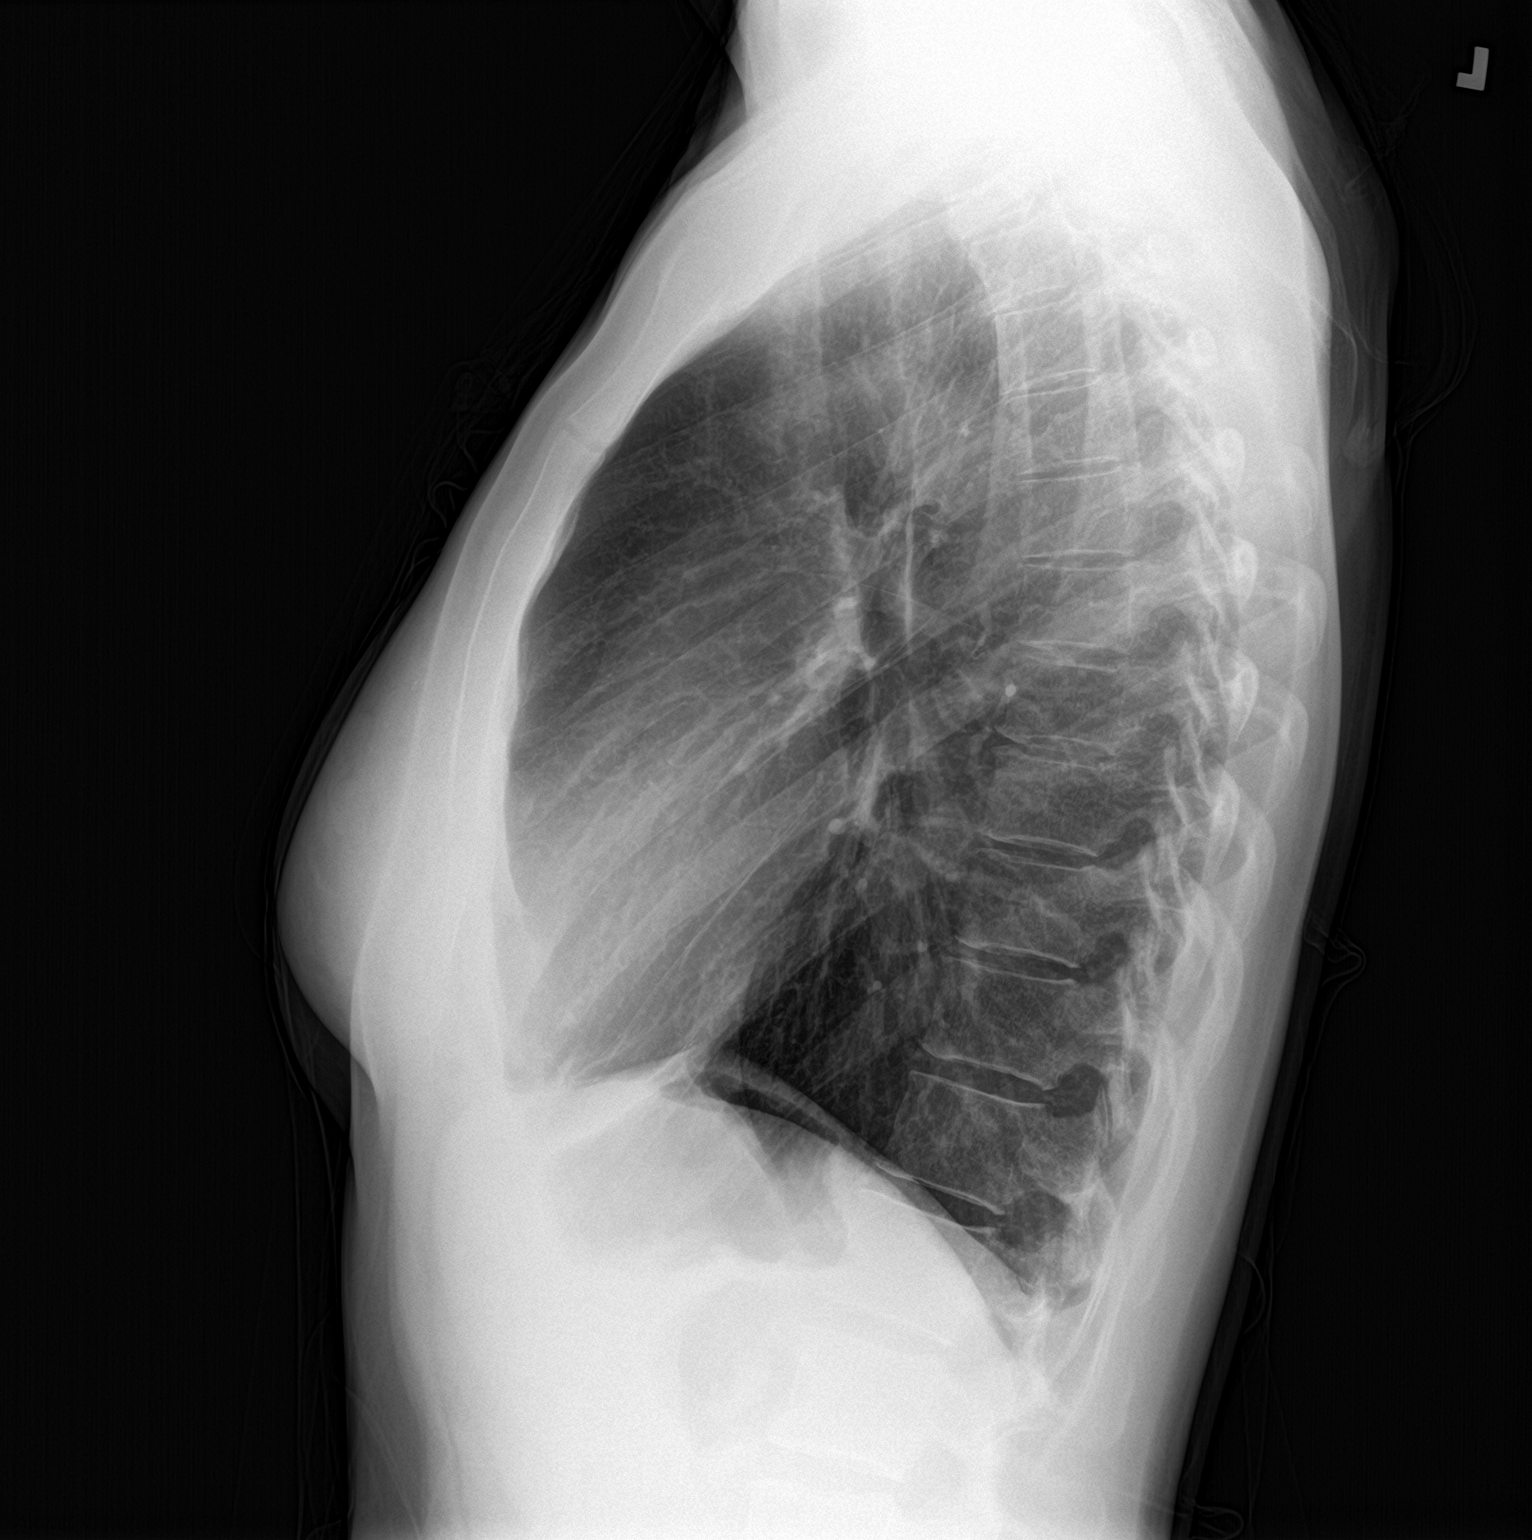

[2 of 2 positions shown; findings below may reference images not displayed]

FINDINGS: The heart size and mediastinal contours are within normal limits.
Both lungs are clear. The visualized skeletal structures are
unremarkable.
IMPRESSION: No active cardiopulmonary disease.

## 2022-07-29 ENCOUNTER — Ambulatory Visit
Admission: RE | Admit: 2022-07-29 | Discharge: 2022-07-29 | Disposition: A | Discharge status: Home / Self Care | Payer: 59 | Source: Ambulatory Visit

## 2022-07-29 ENCOUNTER — Other Ambulatory Visit: Payer: Self-pay

## 2022-07-29 VITALS — BP 116/75 | HR 57 | Temp 98.0°F | Resp 18 | Ht 70.0 in | Wt 140.0 lb

## 2022-07-29 DIAGNOSIS — J209 Acute bronchitis, unspecified: Secondary | ICD-10-CM

## 2022-07-29 MED ORDER — BENZONATATE 100 MG PO CAPS: 100.0000 mg | ORAL_CAPSULE | Freq: Three times a day (TID) | ORAL | 0 refills | Status: AC

## 2022-07-29 MED ORDER — PREDNISONE 20 MG PO TABS: 40.0000 mg | ORAL_TABLET | Freq: Every day | ORAL | 0 refills | Status: AC

## 2022-07-29 NOTE — Discharge Instructions (Signed)
Start prednisone 40 mg for 5 days.  Do not take NSAIDs with this medication including aspirin, ibuprofen/Advil, naproxen/Aleve.  You can use Tylenol/acetaminophen, Mucinex, Flonase for symptom relief.  Make sure you are resting and drinking plenty of fluid.  Use Tessalon for cough up to 3 times a day.  If your symptoms are not improving by next week please return for reevaluation.  If anything worsens and you have high fever, chest pain, shortness of breath, nausea/vomiting interfering with oral intake, weakness you need to be seen immediately.

## 2022-07-29 NOTE — ED Triage Notes (Signed)
Pt presents to Urgent Care with c/o cough x 10 days. Negative home COVID test. Reports having issues w/ her voice.

## 2022-07-29 NOTE — ED Provider Notes (Signed)
[facility]    CSN: [id number] Arrival date & time: 07/29/22  1157      History   Chief Complaint Chief Complaint  Patient presents with   Cough    HPI Shelby [name] is a 41 y.o. female.   Patient presents today with a 10-day history of URI symptoms including cough and congestion.  Reports fatigue and sore throat.  Overall she has had improvement in symptoms but they have not resolved prompting evaluation.  She denies any fever, chest pain, shortness of breath.  She has tried over-the-counter medications including cough medicine without improvement of symptoms.  She has also tried a humidifier, nasal rinses, tea, pushing fluids without relief.  Denies any history of allergies, asthma, COPD.  She did take an at-home COVID test when symptoms began that was negative.  She denies history of diabetes.  Denies any recent antibiotics or steroids.    Past Medical History:  Diagnosis Date   History of cataract     There are no problems to display for this patient.   Past Surgical History:  Procedure Laterality Date   CATARACT EXTRACTION  03/2019    OB History   No obstetric history on file.      Home Medications    Prior to Admission medications   Medication Sig Start Date End Date Taking? Authorizing Provider  benzonatate (TESSALON) 100 MG capsule Take 1 capsule (100 mg total) by mouth every 8 (eight) hours. 07/29/22  Yes Dionna Wiedemann K, PA-C  guaiFENesin (MUCINEX) 600 MG 12 hr tablet Take 600 mg by mouth 2 (two) times daily.   Yes [provider]  predniSONE (DELTASONE) 20 MG tablet Take 2 tablets (40 mg total) by mouth daily for 5 days. 07/29/22 08/03/22 Yes Airelle Everding K, PA-C  ALBUTEROL IN Inhale into the lungs.    [provider]  Ascorbic Acid (VITAMIN C PO) Take by mouth daily.    [provider]  Cholecalciferol (VITAMIN D3 PO) Take by mouth daily.    [provider]    Family History Family History  Problem  Relation Age of Onset   Healthy Mother    Cancer Father    Food Allergy Father    Asthma Father    Other Father        Cancerous tumor on kidney   Lung cancer Maternal Grandfather    Esophageal cancer Maternal Uncle    Breast cancer Half-Sister     Social History Social History   Tobacco Use   Smoking status: Former    Types: Cigarettes   Smokeless tobacco: Never   Tobacco comments:    smoked in college  Vaping Use   Vaping Use: Never used  Substance Use Topics   Alcohol use: Not Currently    Comment: occasionally   Drug use: No     Allergies   Patient has no known allergies.   Review of Systems Review of Systems  Constitutional:  Positive for activity change and fatigue. Negative for appetite change and fever.  HENT:  Positive for congestion. Negative for sinus pressure, sneezing and sore throat.   Respiratory:  Positive for cough. Negative for shortness of breath.   Cardiovascular:  Negative for chest pain.  Gastrointestinal:  Negative for abdominal pain, diarrhea, nausea and vomiting.  Neurological:  Negative for dizziness, light-headedness and headaches.     Physical Exam Triage Vital Signs ED Triage Vitals  Enc Vitals Group     BP 07/29/22 1210 116/75  Pulse Rate 07/29/22 1210 (!) 57     Resp 07/29/22 1210 18     Temp 07/29/22 1210 98 F (36.7 C)     Temp Source 07/29/22 1210 Oral     SpO2 07/29/22 1210 100 %     Weight 07/29/22 1206 140 lb (63.5 kg)     Height 07/29/22 1206 5\' 10"  (1.778 m)     Head Circumference --      Peak Flow --      Pain Score 07/29/22 1206 0     Pain Loc --      Pain Edu? --      Excl. in GC? --    No data found.  Updated Vital Signs BP 116/75   Pulse (!) 57   Temp 98 F (36.7 C) (Oral)   Resp 18   Ht 5\' 10"  (1.778 m)   Wt 140 lb (63.5 kg)   LMP 07/25/2022   SpO2 100%   BMI 20.09 kg/m   Visual Acuity Right Eye Distance:   Left Eye Distance:   Bilateral Distance:    Right Eye Near:   Left Eye Near:     Bilateral Near:     Physical Exam Vitals reviewed.  Constitutional:      General: She is awake. She is not in acute distress.    Appearance: Normal appearance. She is well-developed. She is not ill-appearing.     Comments: Very pleasant female appears stated age in no acute distress sitting comfortably in exam room  HENT:     Head: Normocephalic and atraumatic.     Right Ear: Tympanic membrane, ear canal and external ear normal. Tympanic membrane is not erythematous or bulging.     Left Ear: Tympanic membrane, ear canal and external ear normal. Tympanic membrane is not erythematous or bulging.     Nose:     Right Sinus: No maxillary sinus tenderness or frontal sinus tenderness.     Left Sinus: No maxillary sinus tenderness or frontal sinus tenderness.     Mouth/Throat:     Pharynx: Uvula midline. No oropharyngeal exudate or posterior oropharyngeal erythema.  Cardiovascular:     Rate and Rhythm: Normal rate and regular rhythm.     Heart sounds: Normal heart sounds, S1 normal and S2 normal. No murmur heard. Pulmonary:     Effort: Pulmonary effort is normal.     Breath sounds: Normal breath sounds. No wheezing, rhonchi or rales.     Comments: Clear to auscultation bilaterally Psychiatric:        Behavior: Behavior is cooperative.      UC Treatments / Results  Labs (all labs ordered are listed, but only abnormal results are displayed) Labs Reviewed - No data to display  EKG   Radiology No results found.  Procedures Procedures (including critical care time)  Medications Ordered in UC Medications - No data to display  Initial Impression / Assessment and Plan / UC Course  I have reviewed the triage vital signs and the nursing notes.  Pertinent labs & imaging results that were available during my care of the patient were reviewed by me and considered in my medical decision making (see chart for details).     Patient is well-appearing, afebrile, nontoxic,  nontachycardic, with oxygen saturation of 100%.  No indication for viral testing as she has been symptomatic for over a week and this would not change management.  No evidence of acute infection on physical exam that warrant initiation of antibiotics.  Chest x-ray was deferred as she has no adventitious lung sounds on exam and oxygen saturation of 100%.  Suspect symptoms are related to recent viral infection.  She was started on prednisone burst of 40 mg for 5 days to help manage symptoms with instruction not to take NSAIDs with this medication.  She can use Tessalon for cough.  Recommended over-the-counter medication including Mucinex, Tylenol, Flonase for symptom relief.  She is to rest and drink plenty of fluid.  Discussed that if her symptoms are improving by next week she should return for reevaluation.  If anything worsens and she has high fever, worsening cough, shortness of breath, chest pain, nausea/vomiting interfrontal intake she needs to be seen immediately.  Strict return precautions given.  Final Clinical Impressions(s) / UC Diagnoses   Final diagnoses:  Acute bronchitis, unspecified organism     Discharge Instructions      Start prednisone 40 mg for 5 days.  Do not take NSAIDs with this medication including aspirin, ibuprofen/Advil, naproxen/Aleve.  You can use Tylenol/acetaminophen, Mucinex, Flonase for symptom relief.  Make sure you are resting and drinking plenty of fluid.  Use Tessalon for cough up to 3 times a day.  If your symptoms are not improving by next week please return for reevaluation.  If anything worsens and you have high fever, chest pain, shortness of breath, nausea/vomiting interfering with oral intake, weakness you need to be seen immediately.     ED Prescriptions     Medication Sig Dispense Auth. Provider   predniSONE (DELTASONE) 20 MG tablet Take 2 tablets (40 mg total) by mouth daily for 5 days. 10 tablet Ardean Melroy K, PA-C   benzonatate (TESSALON) 100 MG  capsule Take 1 capsule (100 mg total) by mouth every 8 (eight) hours. 21 capsule Jrue Yambao K, PA-C      PDMP not reviewed this encounter.   Jeani Hawking, PA-C 07/29/22 1227

## 2022-08-06 ENCOUNTER — Other Ambulatory Visit: Payer: Self-pay | Admitting: Obstetrics & Gynecology

## 2022-08-06 DIAGNOSIS — Z9189 Other specified personal risk factors, not elsewhere classified: Secondary | ICD-10-CM

## 2022-09-06 ENCOUNTER — Other Ambulatory Visit: Payer: Self-pay | Admitting: Obstetrics & Gynecology

## 2022-09-06 DIAGNOSIS — Z803 Family history of malignant neoplasm of breast: Secondary | ICD-10-CM

## 2022-09-06 DIAGNOSIS — Z9189 Other specified personal risk factors, not elsewhere classified: Secondary | ICD-10-CM

## 2022-09-07 ENCOUNTER — Other Ambulatory Visit: Payer: Self-pay | Admitting: Obstetrics & Gynecology

## 2022-09-07 DIAGNOSIS — Z9189 Other specified personal risk factors, not elsewhere classified: Secondary | ICD-10-CM

## 2022-09-07 DIAGNOSIS — Z803 Family history of malignant neoplasm of breast: Secondary | ICD-10-CM

## 2022-10-09 ENCOUNTER — Ambulatory Visit
Admission: RE | Admit: 2022-10-09 | Discharge: 2022-10-09 | Disposition: A | Discharge status: Home / Self Care | Payer: 59 | Source: Ambulatory Visit | Attending: Obstetrics & Gynecology | Admitting: Obstetrics & Gynecology

## 2022-10-09 DIAGNOSIS — Z9189 Other specified personal risk factors, not elsewhere classified: Secondary | ICD-10-CM

## 2022-10-09 DIAGNOSIS — Z803 Family history of malignant neoplasm of breast: Secondary | ICD-10-CM

## 2022-10-09 MED ORDER — GADOPICLENOL 0.5 MMOL/ML IV SOLN
7.0000 mL | Freq: Once | INTRAVENOUS | Status: AC | PRN
  Administered 2022-10-09: 7 mL via INTRAVENOUS

## 2024-08-09 ENCOUNTER — Ambulatory Visit: Payer: Self-pay
